# Patient Record
Sex: Female | Born: 2000 | Race: White | Hispanic: No | Marital: Single | Smoking: Never smoker
Health system: Southern US, Community
[De-identification: ages and names within clinical notes are randomized; demographics above are authoritative.]

---
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
## 2023-06-12 DIAGNOSIS — N7689 Other specified inflammation of vagina and vulva: Secondary | ICD-10-CM | POA: Diagnosis not present

## 2023-06-12 DIAGNOSIS — F331 Major depressive disorder, recurrent, moderate: Secondary | ICD-10-CM | POA: Diagnosis not present

## 2023-06-12 DIAGNOSIS — Z01419 Encounter for gynecological examination (general) (routine) without abnormal findings: Secondary | ICD-10-CM | POA: Diagnosis not present

## 2023-06-20 DIAGNOSIS — F331 Major depressive disorder, recurrent, moderate: Secondary | ICD-10-CM | POA: Diagnosis not present

## 2023-07-03 DIAGNOSIS — F331 Major depressive disorder, recurrent, moderate: Secondary | ICD-10-CM | POA: Diagnosis not present

## 2023-07-11 DIAGNOSIS — F331 Major depressive disorder, recurrent, moderate: Secondary | ICD-10-CM | POA: Diagnosis not present

## 2023-08-02 DIAGNOSIS — F331 Major depressive disorder, recurrent, moderate: Secondary | ICD-10-CM | POA: Diagnosis not present

## 2023-08-10 DIAGNOSIS — F331 Major depressive disorder, recurrent, moderate: Secondary | ICD-10-CM | POA: Diagnosis not present

## 2023-08-17 DIAGNOSIS — F331 Major depressive disorder, recurrent, moderate: Secondary | ICD-10-CM | POA: Diagnosis not present

## 2023-09-11 DIAGNOSIS — F331 Major depressive disorder, recurrent, moderate: Secondary | ICD-10-CM | POA: Diagnosis not present

## 2023-09-15 DIAGNOSIS — Z0001 Encounter for general adult medical examination with abnormal findings: Secondary | ICD-10-CM | POA: Diagnosis not present

## 2023-09-15 DIAGNOSIS — Z7689 Persons encountering health services in other specified circumstances: Secondary | ICD-10-CM | POA: Diagnosis not present

## 2023-09-15 DIAGNOSIS — G43009 Migraine without aura, not intractable, without status migrainosus: Secondary | ICD-10-CM | POA: Diagnosis not present

## 2023-09-15 DIAGNOSIS — F909 Attention-deficit hyperactivity disorder, unspecified type: Secondary | ICD-10-CM | POA: Diagnosis not present

## 2023-09-18 DIAGNOSIS — F331 Major depressive disorder, recurrent, moderate: Secondary | ICD-10-CM | POA: Diagnosis not present
